# Patient Record
Sex: Female | Born: 1955 | Race: White | Hispanic: No | Marital: Married | Smoking: Never smoker
Health system: Western US, Academic
[De-identification: ages and names within clinical notes are randomized; demographics above are authoritative.]

---

## 2020-06-27 IMAGING — MR RM ANGIO CEREBRAL ARTERIAL
10 of 16 series · 16 of 48 positions shown · non-contrast
Comparison: none

[Series 101: survey · axial · 5.0mm · 0.98mm/px · 1 of 27 slices shown]
[im 1/27]
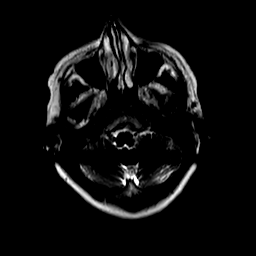

[Series 402: FLAIR · coronal · 4.0mm · 0.52mm/px · 1 of 30 slices shown (1 of 2)]
[im 1/30]
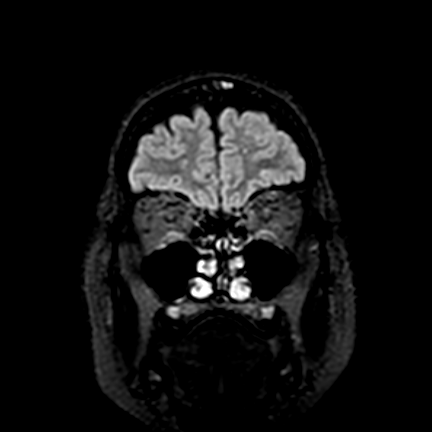

[Series 403: FLAIR · axial · 5.0mm · 0.58mm/px · 1 of 24 slices shown (2 of 2)]
[im 1/24]
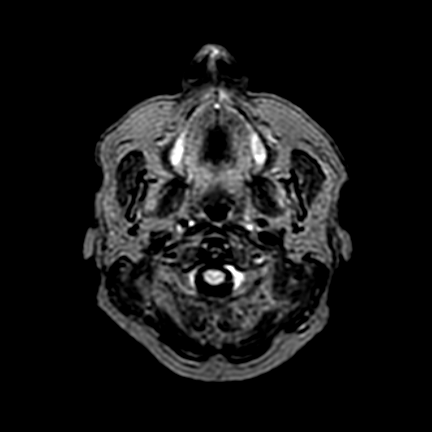

[Series 501: axi difusao · axial · 5.0mm · 0.78mm/px · z∈[-37,+106]mm · 2 of 50 slices shown]
[im 1/50]
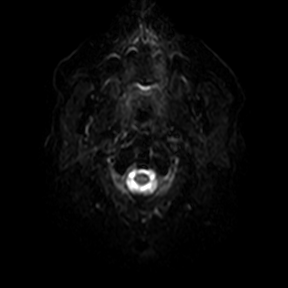
[im 50/50]
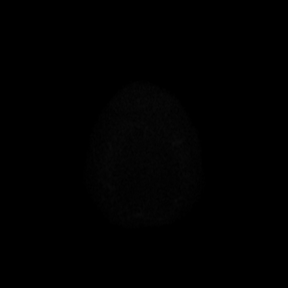

[Series 502: reg - reg · axial · 5.0mm · 0.78mm/px · z∈[-37,+106]mm · 2 of 50 slices shown]
[im 1/50]
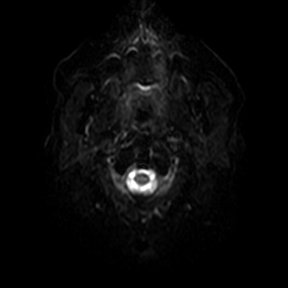
[im 50/50]
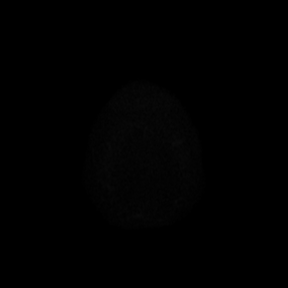

[Series 503: sb0 · axial · 5.0mm · 0.78mm/px · 1 of 25 slices shown]
[im 1/25]
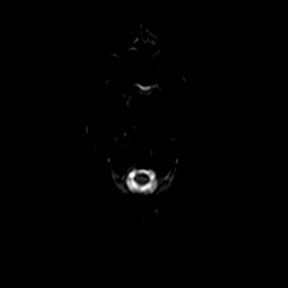

[Series 504: (id) · axial · 5.0mm · 0.78mm/px · 1 of 25 slices shown]
[im 1/25]
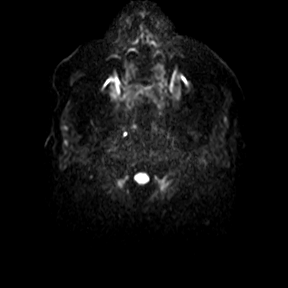

[Series 505: dadc · axial · 5.0mm · 0.78mm/px · 1 of 25 slices shown]
[im 1/25]
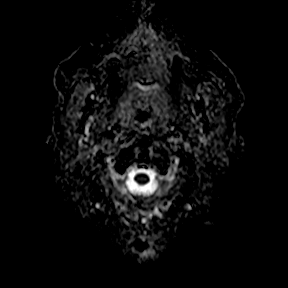

[Series 601: SWI · axial · 2.5mm · 0.43mm/px · z∈[-39,+108]mm · 5 of 120 slices shown (1 of 2)]
[im 1/120]
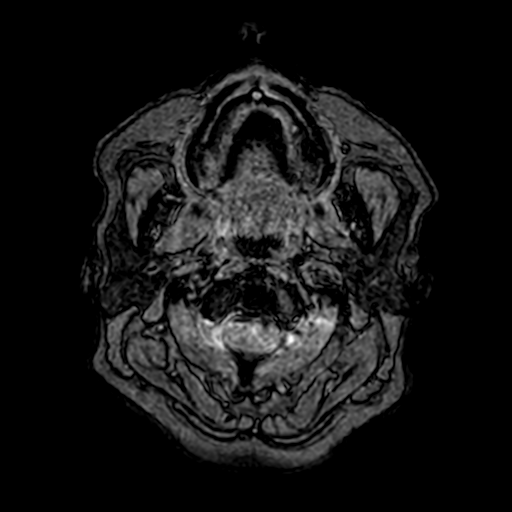
[im 30/120]
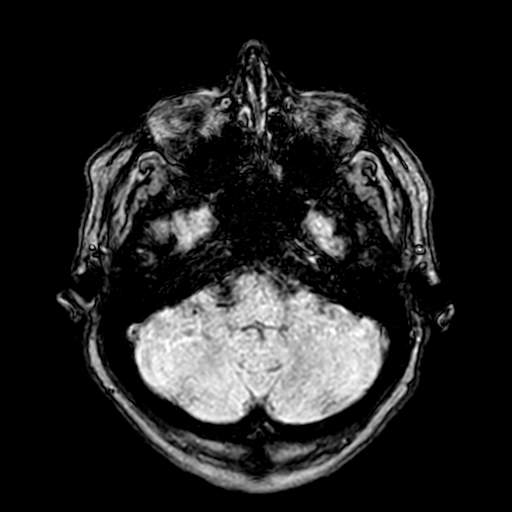
[im 60/120]
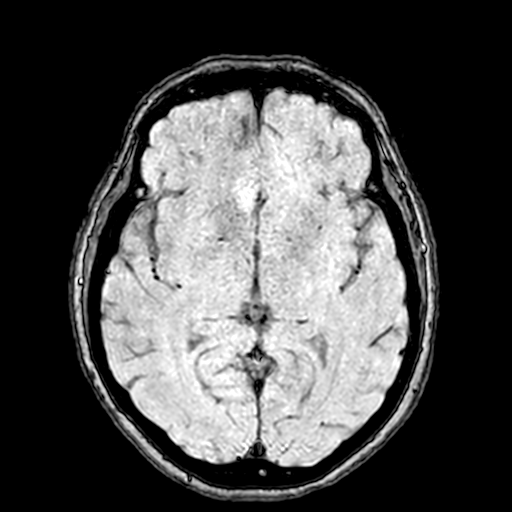
[im 90/120]
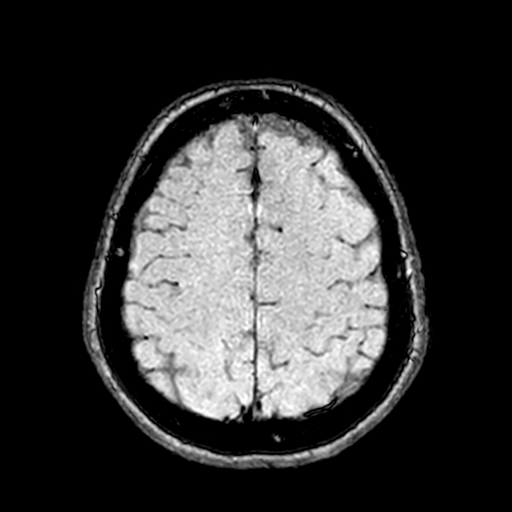
[im 120/120]
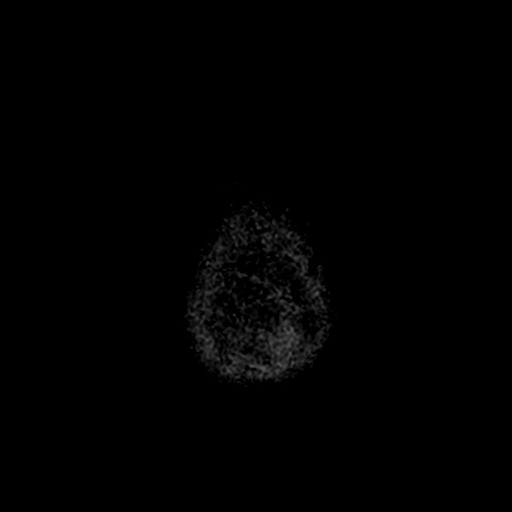

[Series 602: SWI · axial · 5.0mm · 0.43mm/px · 1 of 23 slices shown (2 of 2)]
[im 1/23]
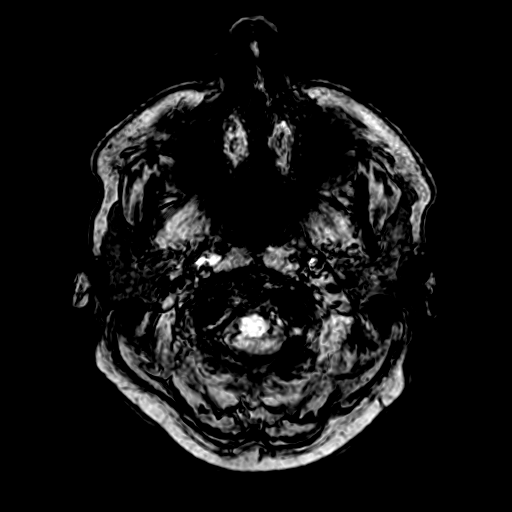

[16 of 48 positions shown; findings below may reference images not displayed]

[facility]
METODOLOGIA:
As imagens de angiorressonância arterial intracraniana foram obtidas com a técnica NE-SOR.
ANÁLISE:
Segmentos visibilizados das artérias carótidas internas não apresentam alterações significativas.
As artérias cerebrais anteriores, médias e posteriores apresentam calibre e sinal de fluxo normais.
ANGIORRESSONÂNCIA ARTERIAL INTRACRANIANA
Não há evidência de oclusões ou estenoses significativas nos segmentos intradurais das artérias vertebrais e 
artéria basilar.
Não há evidencia de malformações arteriovenosas ou aneurismas nos grandes ramos arteriais dos sistemas 
vértebro-basilar  e  carotídeo  intracranianos,  salientando-se  a  limitação  do  método  para  a  detecção  de 
pequenos aneurismas, sendo a angiografia digital com a técnica de subtração é o método "padrão ouro" para 
esta finalidade.
IMPRESSÃO:
Estudo por angiorressonância arterial intracraniana dentro dos padrões da normalidade.

## 2020-06-27 IMAGING — MR RM BRACO DIR
8 of 10 series · 27 of 40 positions shown · non-contrast
Comparison: none

[Series 101: survey · axial · 10.0mm · 0.72mm/px · 1 of 15 slices shown]
[im 1/15]
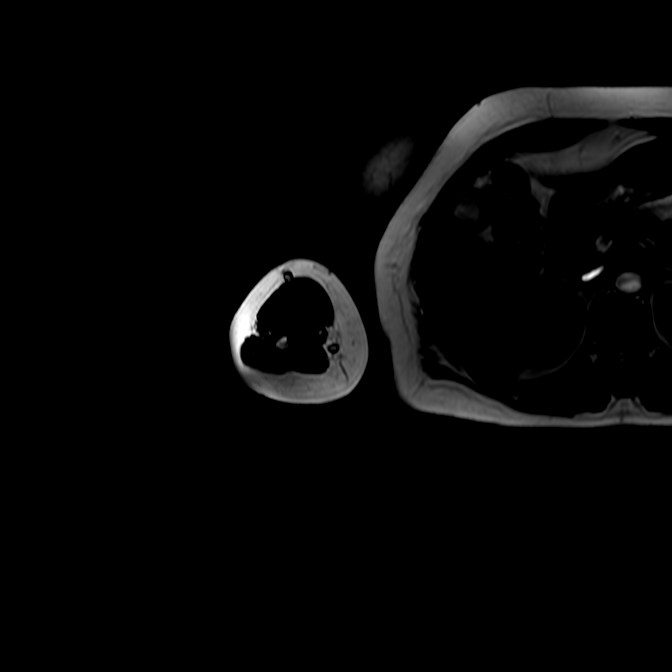

[Series 201: T1 · coronal · 3.5mm · 0.65mm/px · 4 of 48 slices shown (1 of 2)]
[im 1/48]
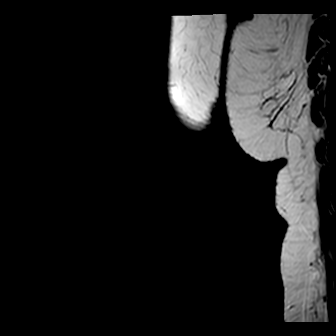
[im 16/48]
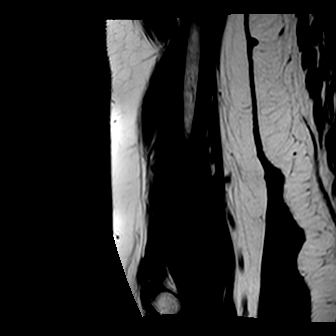
[im 32/48]
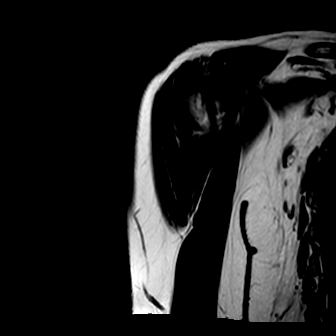
[im 48/48]
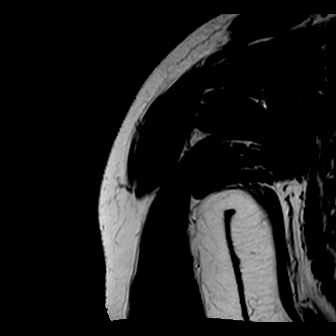

[Series 202: T1 · coronal · 3.5mm · 0.65mm/px · 3 of 24 slices shown (2 of 2)]
[im 1/24]
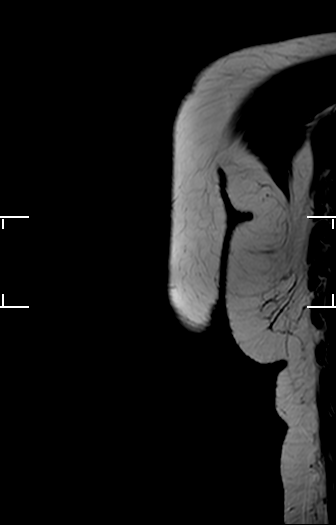
[im 12/24]
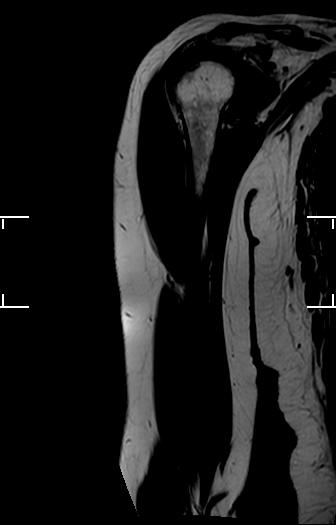
[im 24/24]
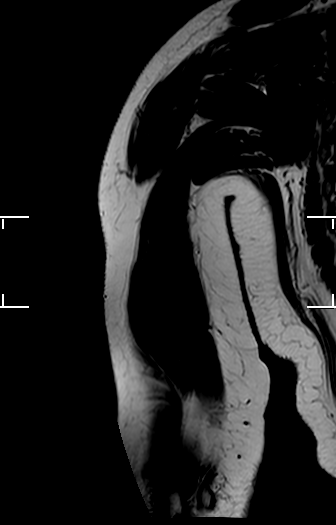

[Series 301: STIR · sagittal · 3.5mm · 0.56mm/px · 5 of 48 slices shown (1 of 5)]
[im 1/48]
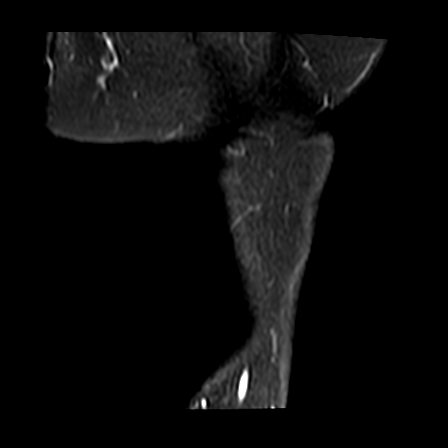
[im 12/48]
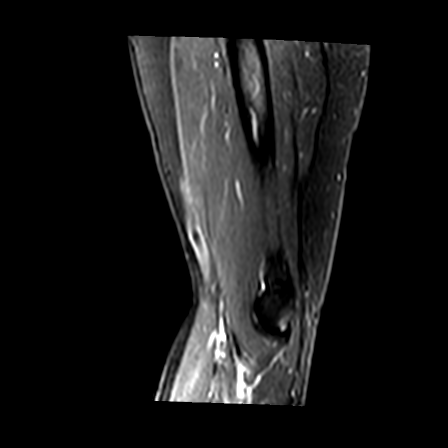
[im 24/48]
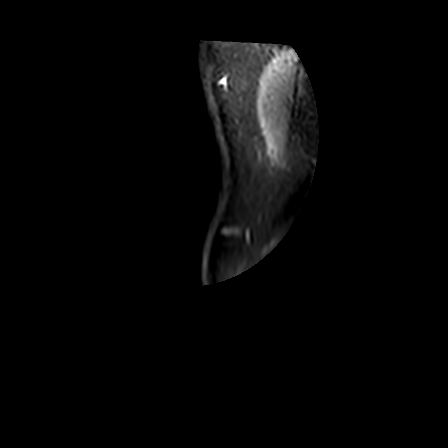
[im 36/48]
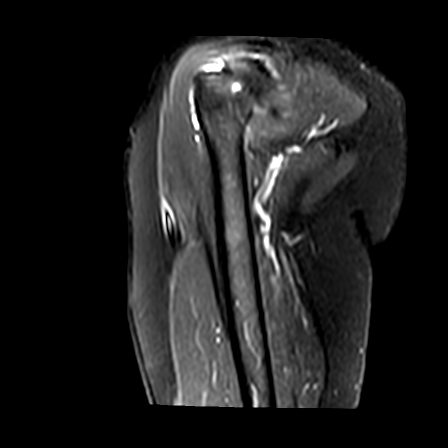
[im 48/48]
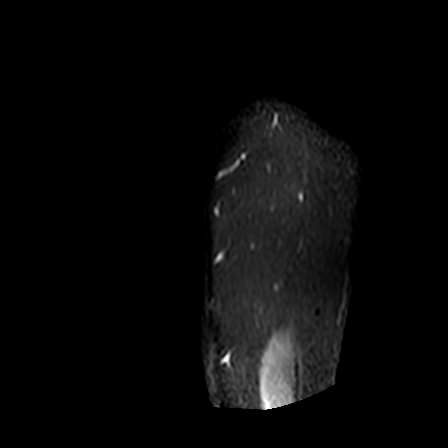

[Series 302: STIR · sagittal · 3.5mm · 0.56mm/px · 3 of 24 slices shown (2 of 5)]
[im 1/24]
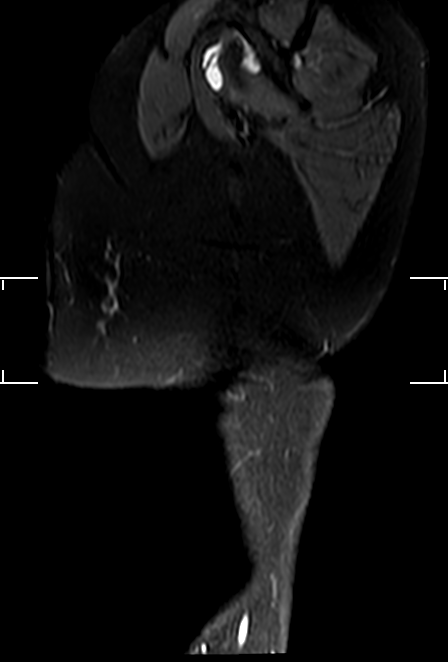
[im 12/24]
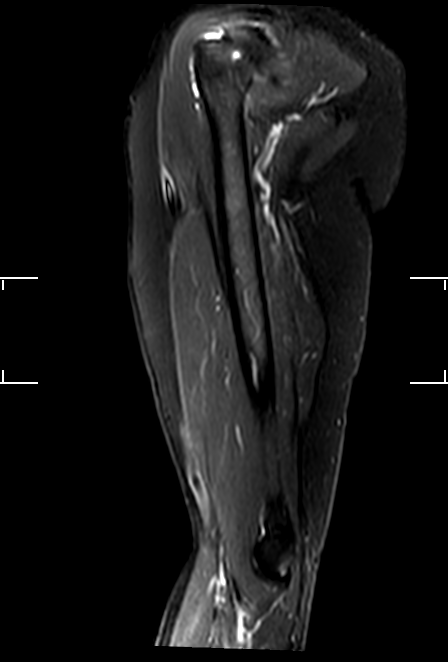
[im 24/24]
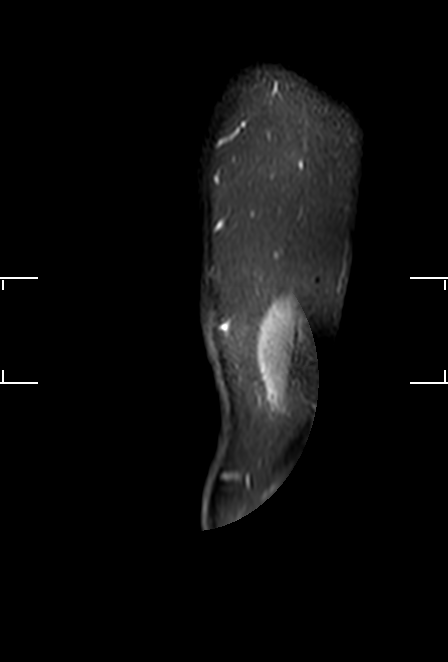

[Series 401: STIR · coronal · 3.5mm · 0.55mm/px · 6 of 49 slices shown (3 of 5)]
[im 1/49]
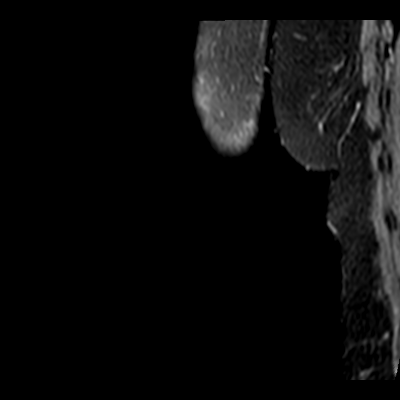
[im 10/49]
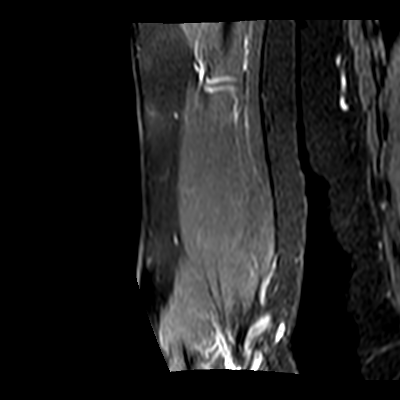
[im 20/49]
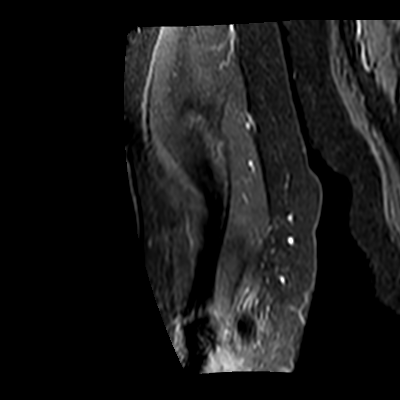
[im 29/49]
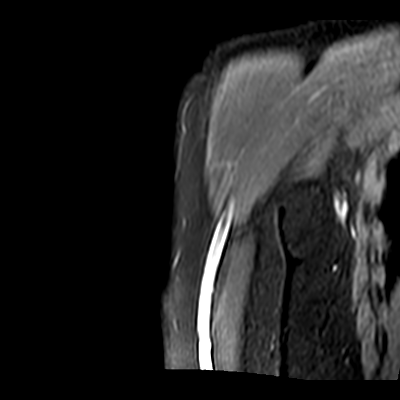
[im 39/49]
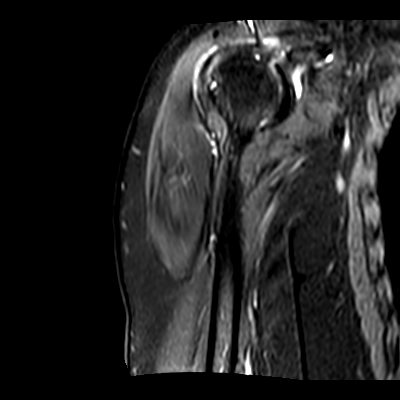
[im 49/49]
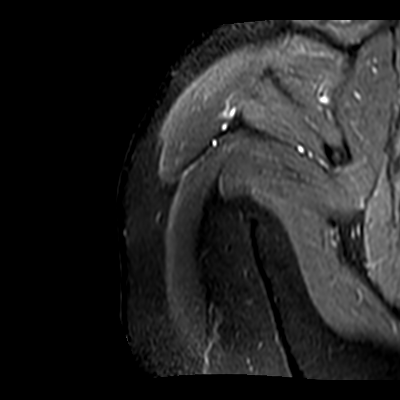

[Series 402: STIR · 3 of 25 slices shown (4 of 5)]
[im 1/25]
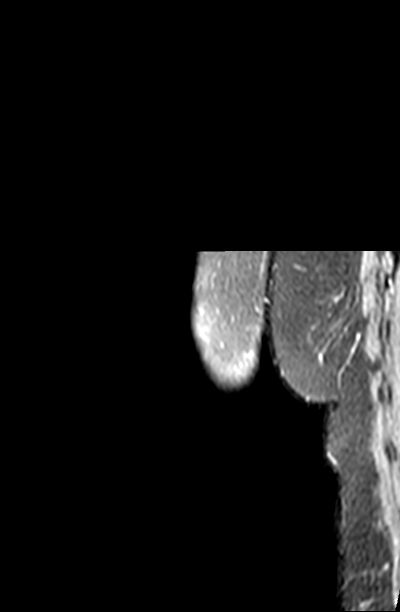
[im 13/25]
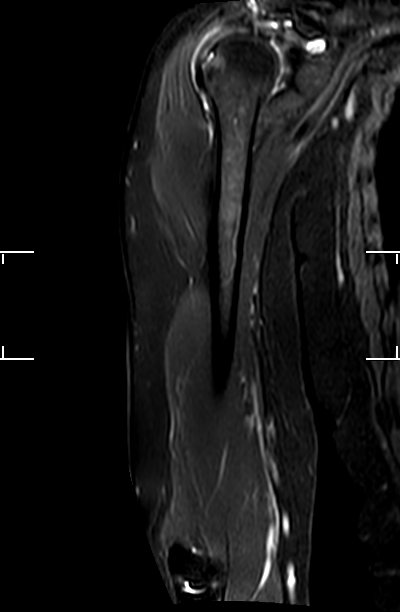
[im 25/25]
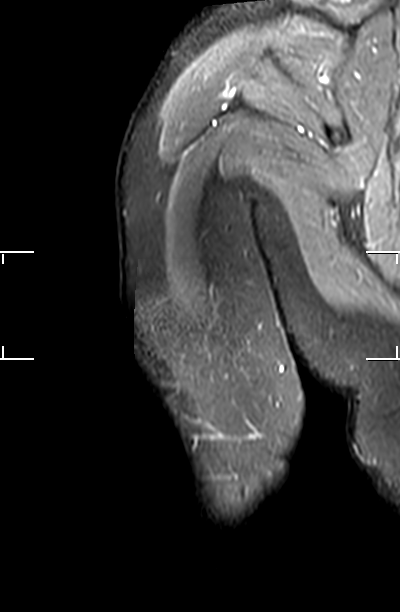

[Series 501: STIR · axial · 6.0mm · 0.52mm/px · z∈[-183,-126]mm · 2 of 40 slices shown (5 of 5)]
[im 1/40]
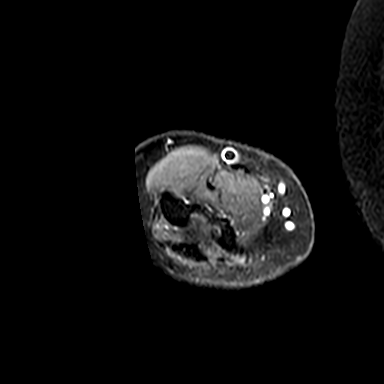
[im 10/40]
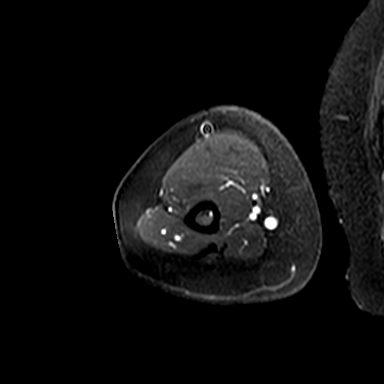

[27 of 40 positions shown; findings below may reference images not displayed]

[facility]
METODOLOGIA: 
Exame  realizado  com  sequências  ponderadas  em  T1  e  T2,  em  aquisições  multiplanares,sem   a 
administração do meio de contraste paramagnético por via endovenosa.
ANÁLISE: 
Discreto derrame articular glenoumeral,
RESSONÂNCIA MAGNÉTICA DO BRAÇO DIREITO
Distensão difusa da bolsa subacromial subdeltoideana,
Derrame articular discreto no cotovelo.
Alterações artrosicas na articulação acrômio clavicular..
Não há sinais de fraturas.
Planos musculares anatômicos.
Feixes vasculares preservados.
Ausência de formações expansivas.
IMPRESSÃO: 
Derrame articular no ombro e no cotovelo.
Bursite subacromial-subdeltóideana

Artrose acromioclavicular.

## 2020-09-02 IMAGING — MR RM OMBRO DIREITO
4 of 8 series · 16 of 40 positions shown · non-contrast
Comparison: none

[Series 301: raxi dp spair · axial · 3.0mm · 0.31mm/px · z∈[-21,+74]mm · 3 of 30 slices shown]
[im 1/30]
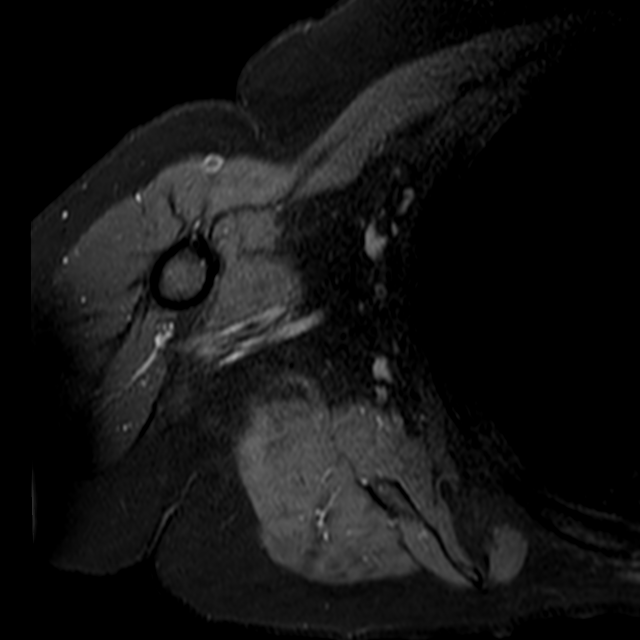
[im 15/30]
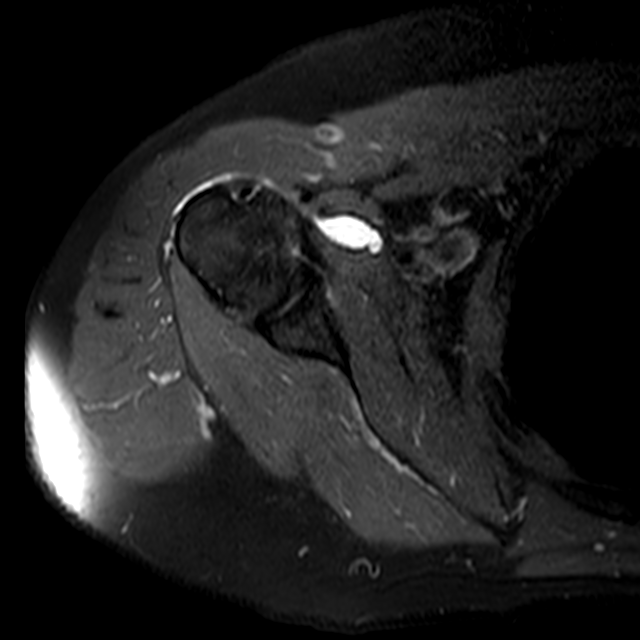
[im 30/30]
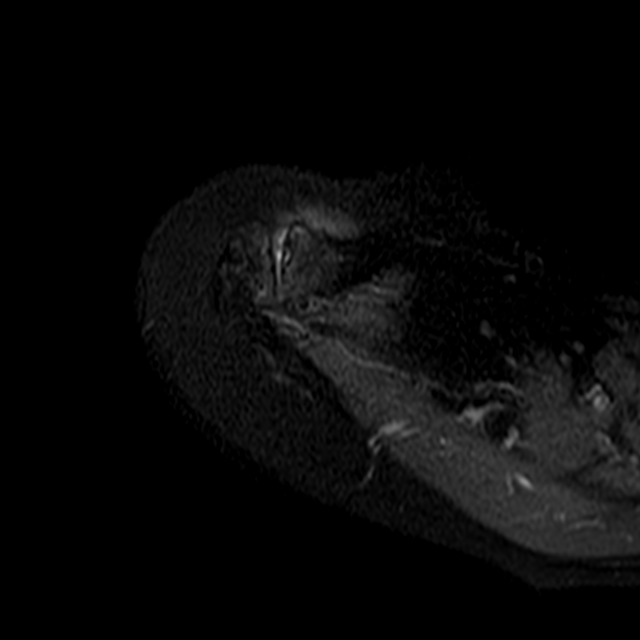

[Series 302: eraxi dp spair · axial · 3.0mm · 0.31mm/px · z∈[-21,+74]mm · 3 of 30 slices shown]
[im 1/30]
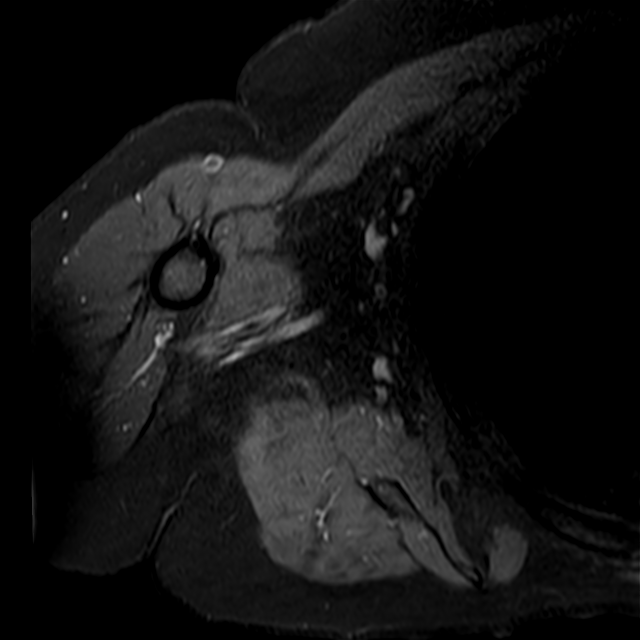
[im 15/30]
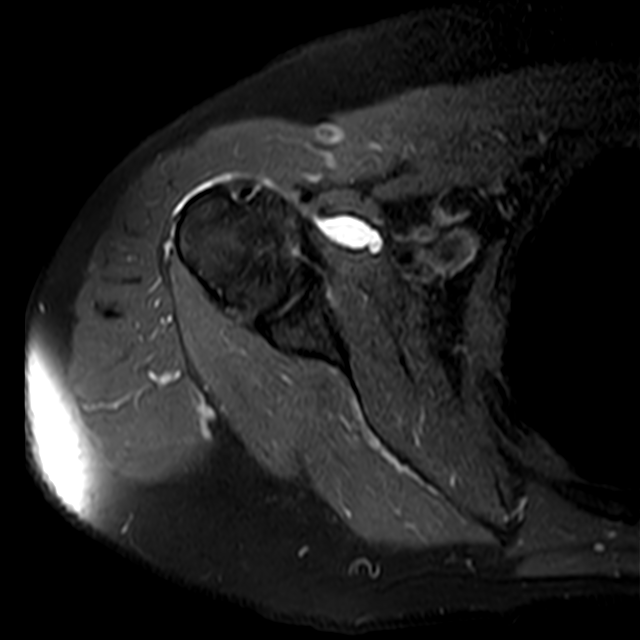
[im 30/30]
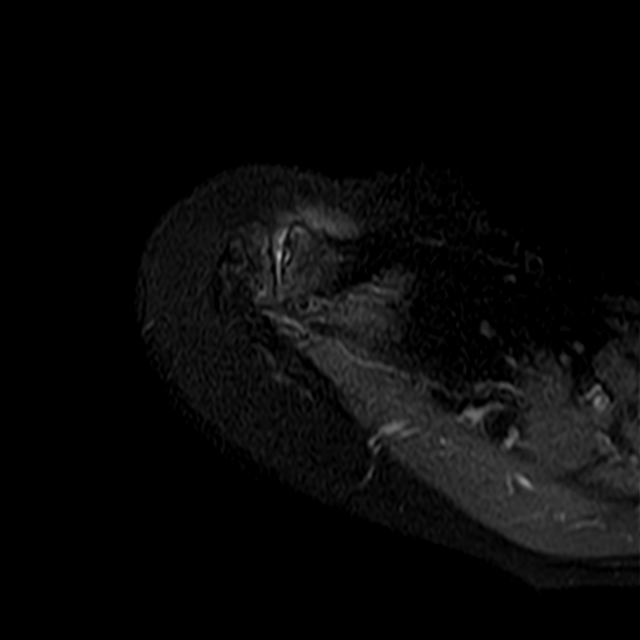

[Series 501: T1 · coronal · 3.5mm · 0.36mm/px · 5 of 25 slices shown (1 of 2)]
[im 1/25]
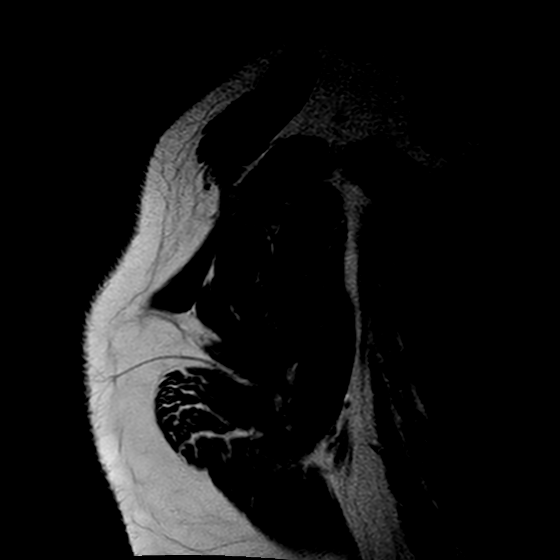
[im 7/25]
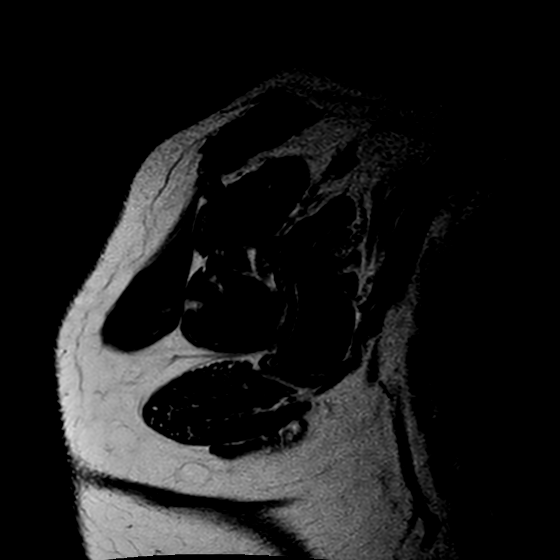
[im 13/25]
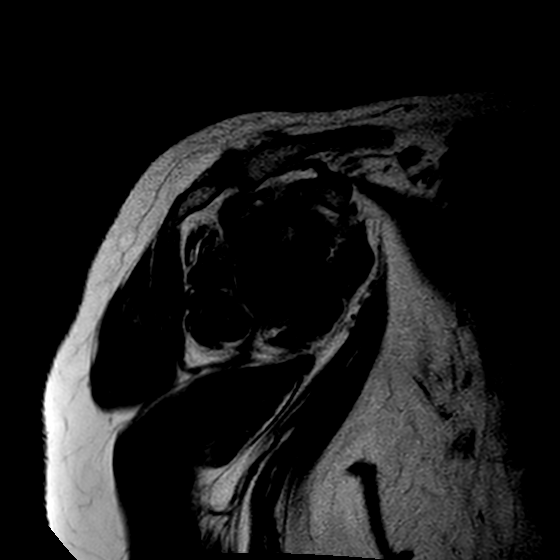
[im 19/25]
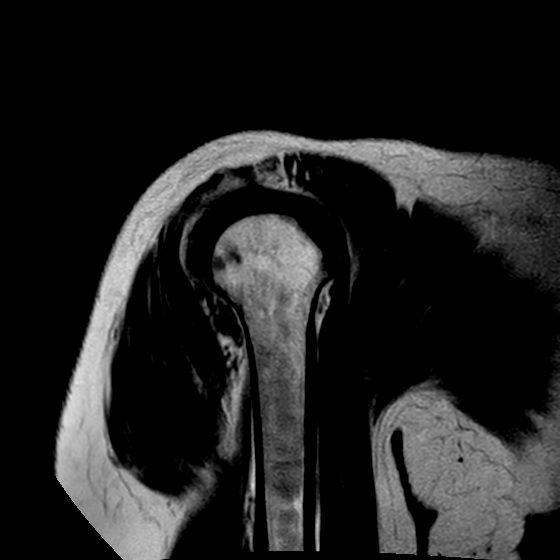
[im 25/25]
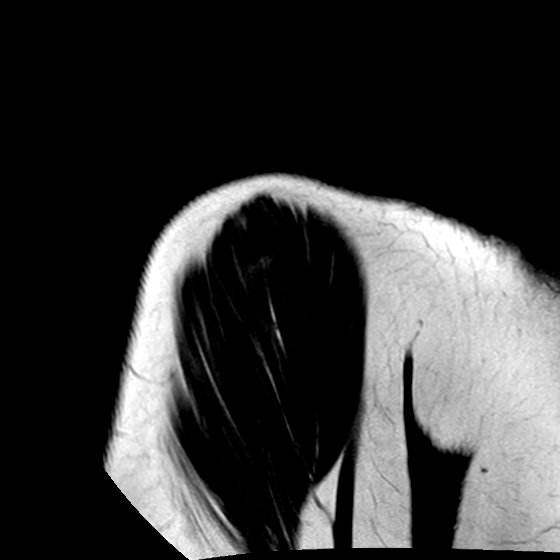

[Series 601: T1 · sagittal · 3.0mm · 0.39mm/px · 5 of 25 slices shown (2 of 2)]
[im 1/25]
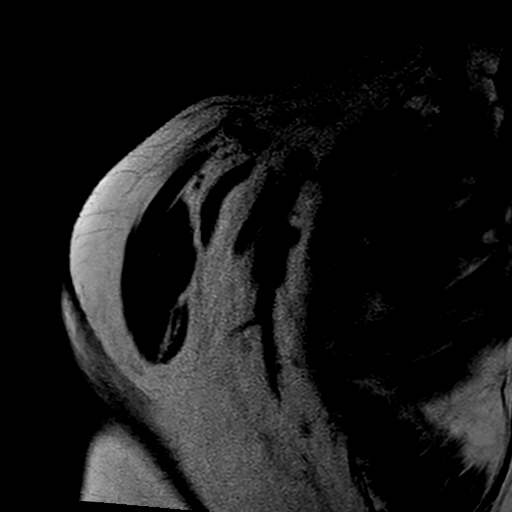
[im 7/25]
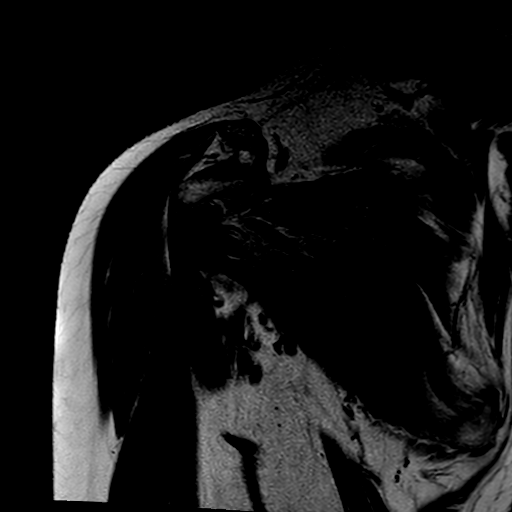
[im 13/25]
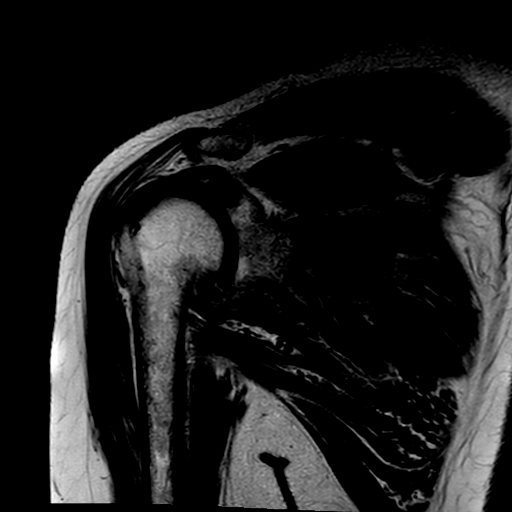
[im 19/25]
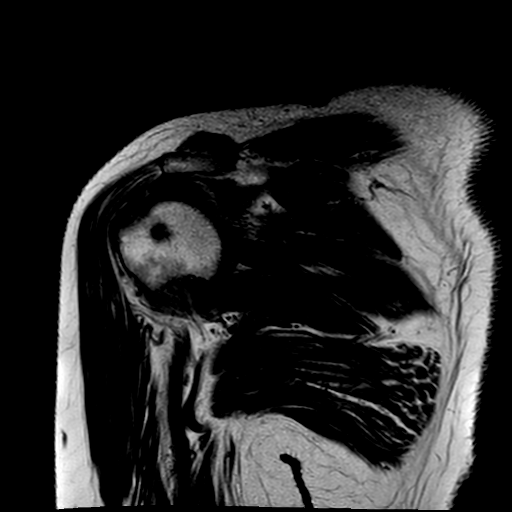
[im 25/25]
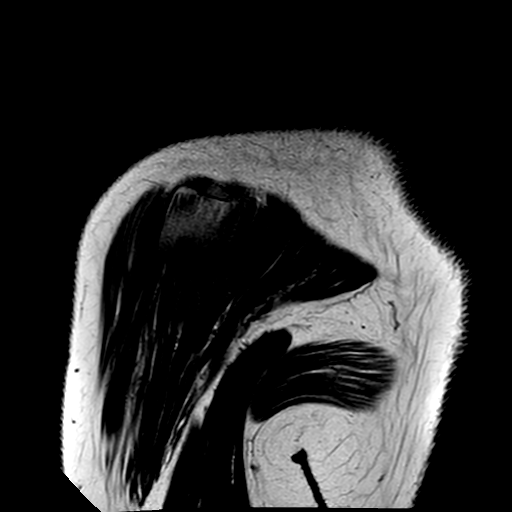

[16 of 40 positions shown; findings below may reference images not displayed]

RESSONÂNCIA MAGNÉTICA DO OMBRO DIREITO
TÉCNICA: Exame realizado em equipamento de alto campo, sendo obtidas imagens multiplanares ponderadas
em T1, T2 e STIR.
RELATÓRIO: 
Acrômio de morfologia curva, sem inclinação anômala.
Articulação acromioclavicular apresentando alterações degenerativas.
Demais estruturas ósseas e restante das superfícies condrais preservadas.
Lábio da glenoide sem sinais de roturas.
Tendinopatia do supraespinal, com rotura transfixante insercional nas fibras dos terços
anterior e médio, medindo cerca de 1,2 cm de extensão, com retração proximal estimada em
cerca de 1,4 cm.
Moderada tendinopatia do infraespinal e do subescapular, sem roturas.
Tendão da cabeça longa do músculo bíceps braquial de espessura, sinal e trajeto no sulco intertubercular
normais.
Pequeno derrame articular, comunicando-se com a bursa subacromial subdeltoidea.
Estruturas musculares preservadas.
Planos adiposos e neurovasculares íntegros.

## 2022-11-11 ENCOUNTER — Other Ambulatory Visit: Admit: 2022-11-11 | Discharge: 2022-11-11 | Disposition: A | Discharge status: Home Health | Payer: Medicare Other

## 2022-11-11 ENCOUNTER — Ambulatory Visit: Payer: Medicare Other | Attending: Adolescent Medicine | Admitting: Adolescent Medicine

## 2022-11-11 VITALS — BP 158/96 | HR 81 | Wt 174.2 lb

## 2022-11-11 DIAGNOSIS — H04129 Dry eye syndrome of unspecified lacrimal gland: Secondary | ICD-10-CM | POA: Insufficient documentation

## 2022-11-11 DIAGNOSIS — M2550 Pain in unspecified joint: Secondary | ICD-10-CM

## 2022-11-11 DIAGNOSIS — J309 Allergic rhinitis, unspecified: Secondary | ICD-10-CM | POA: Insufficient documentation

## 2022-11-11 DIAGNOSIS — E039 Hypothyroidism, unspecified: Secondary | ICD-10-CM | POA: Insufficient documentation

## 2022-11-11 DIAGNOSIS — R682 Dry mouth, unspecified: Secondary | ICD-10-CM

## 2022-11-11 DIAGNOSIS — K219 Gastro-esophageal reflux disease without esophagitis: Secondary | ICD-10-CM

## 2022-11-11 DIAGNOSIS — E785 Hyperlipidemia, unspecified: Secondary | ICD-10-CM

## 2022-11-11 LAB — ANTI-THYROID PEROXIDASE AB, BLOOD: Anti TPO Abs: 23 IU/ML — ABNORMAL HIGH (ref <9)

## 2022-11-11 MED ORDER — ZOLPIDEM TARTRATE 10 MG OR TABS
10.00 mg | ORAL_TABLET | Freq: Every evening | ORAL | Status: AC | PRN
Start: 2022-10-18 — End: ?

## 2022-11-11 MED ORDER — OMEPRAZOLE 40 MG OR CPDR
40.00 mg | DELAYED_RELEASE_CAPSULE | Freq: Every day | ORAL | Status: AC
Start: 2022-08-30 — End: ?

## 2022-11-11 MED ORDER — SPIRONOLACTONE 25 MG OR TABS
25.00 mg | ORAL_TABLET | Freq: Every day | ORAL | Status: AC
Start: 2022-10-01 — End: ?

## 2022-11-11 MED ORDER — ALPRAZOLAM 1 MG OR TABS
1.00 mg | ORAL_TABLET | Freq: Two times a day (BID) | ORAL | Status: AC | PRN
Start: 2022-10-26 — End: ?

## 2022-11-11 MED ORDER — GABAPENTIN 300 MG OR CAPS
300.00 mg | ORAL_CAPSULE | Freq: Three times a day (TID) | ORAL | Status: AC
Start: 2022-10-26 — End: ?

## 2022-11-11 MED ORDER — LEVOTHYROXINE SODIUM 100 MCG OR TABS
100.00 ug | ORAL_TABLET | Freq: Every day | ORAL | Status: AC
Start: 2022-10-01 — End: ?

## 2022-11-11 MED ORDER — FENOFIBRATE 160 MG OR TABS
160.00 mg | ORAL_TABLET | Freq: Every day | ORAL | Status: AC
Start: 2022-10-26 — End: ?

## 2022-11-11 MED ORDER — CYCLOSPORINE 0.05 % OP EMUL
1.00 [drp] | Freq: Two times a day (BID) | OPHTHALMIC | Status: AC
Start: 2022-09-24 — End: ?

## 2022-11-11 MED ORDER — SUMATRIPTAN SUCCINATE 100 MG OR TABS
100.00 mg | ORAL_TABLET | Freq: Once | ORAL | Status: AC | PRN
Start: 2022-11-08 — End: ?

## 2022-11-11 MED ORDER — ROSUVASTATIN CALCIUM 40 MG OR TABS
40.00 mg | ORAL_TABLET | Freq: Every day | ORAL | Status: AC
Start: 2022-11-05 — End: ?

## 2022-11-11 MED ORDER — PHENDIMETRAZINE TARTRATE 35 MG OR TABS
1.00 | ORAL_TABLET | Freq: Three times a day (TID) | ORAL | Status: AC
Start: 2022-10-22 — End: ?

## 2022-11-11 MED ORDER — AUG BETAMETHASONE DIPROPIONATE 0.05 % EX OINT
1.00 | TOPICAL_OINTMENT | Freq: Every day | CUTANEOUS | Status: AC
Start: 2022-10-12 — End: ?

## 2022-11-11 NOTE — Progress Notes (Signed)
Date of Evaluation:  November 11, 2022    Referring Physician:  Self, Referred    Reason(s) for Referral:  Dry lips    History of Present Illness:  This is a 67 year old female with history dry chapped lips, dry eyes, and dry mouth since 2022.  She is as in good health without any history of infections or evidence of immunodeficiency. 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 She does not have HTN and is not on anti-hypertensive (contrary t the diagnosis written in notes from [facility]).  She has welling of left 1st MCP joint. She is also complaining of rhinittis.    Past Medical History;  Mixed hyperlipidemia  Migraine    Family History:  Daughter had UTI.  One sister with breast [place]. One sister had recurrent URI and underwent tonsillectomy.  Mother and grandmother with a history of breast [place].    Social History:      Review of Systems:  CNS:  No headache or dizziness.   CVS:  No palpitation or chest pain.  Respiratory:  No cough or shortness of breath.  GI:  Bowel movements are regular  GU:  No frequency or dysuria  Endocrine: normal  Musculoskeletal:  No myalgias or arthralgias.  Peripheral Nerve:  No numbness or tingling.  The remainder of review of system is normal.    Physical Exam:  HEENT:  Throat normal.  Both pupils equal and reacting to light.  Neck:  Supple.  No lymphadenopathy.  Lungs:  Clear.  Heart:  Within normal limits.  Abdomen:  No organomegaly or mass.  No tenderness.  Extremities: swelling of left MCP joint, only mildly tender.  Neurological:  Normal  Skin:  No skin rashes.    Diagnostic Data:  None    Assessment and Recommendations:   Patient with Sicca symptoms, post-partum hypothyroidism, and arthralgia is evaluated for immunodeficency, Sjogren syndromes.  Labs have been ordered. Labs will be reviewed via zoom appointment.

## 2022-11-12 LAB — ANA (ANTI-NUCLEAR AB), BLOOD: Antinuclear Antibody (ANA), HEp-2000, IgG: NEGATIVE

## 2022-11-12 LAB — LYMPHOCYTE SUBSET PANEL
CD19,%: 15 % (ref 6–29)
CD19,Tot: 312 /MCL (ref 104–795)
CD3+CD4+,%: 61 % (ref 24–64)
CD3+CD4+: 1269 /MCL (ref 477–1634)
CD3+CD8+,%: 14 % (ref 12–45)
CD3+CD8+: 291 /MCL (ref 168–1315)
CD3,%: 76 % (ref 58–89)
CD3,Tot: 1581 /MCL (ref 700–2377)
CD4:CD8: 4.36 RATIO (ref 0.8–5.0)
CD56,%: 8 % (ref 2–32)
CD56,Tot: 166 /MCL (ref 43–680)
Lymphocytes,%: 32 % (ref 14–44)
Tot WBC Count: 6500 /MCL (ref 4000–10500)
Total Lymphocytes: 2080 /MCL (ref 900–3300)

## 2022-11-12 LAB — IGG SUBCLASS PANEL, BLOOD
IgG 1: 408 mg/dL (ref 382–929)
IgG 2: 155 mg/dL — ABNORMAL LOW (ref 242–700)
IgG 3: 9 mg/dL — ABNORMAL LOW (ref 22–176)
IgG 4: 6 mg/dL (ref 5–125)
IgG Total: 665 MG/DL (ref 610–1616)

## 2022-11-12 LAB — IGA, BLOOD: Immunoglobulin A: 37 mg/dL — ABNORMAL LOW (ref 84–499)

## 2022-11-12 LAB — IGM, BLOOD: Immunoglobulin M: 43 mg/dL (ref 35–242)

## 2022-11-14 LAB — TETANUS ANTIBODY, IGG, BLOOD: Tetanus Ab IgG: 0.4 IU/mL

## 2022-11-16 LAB — SSA (RO)(ENA) ANTIBODY, IGG: SSA (RO)(ENA) Ab, IgG: 2 UNITS (ref 0–19)

## 2022-11-16 LAB — SSB, BLOOD: SSB (LA)(ENA) Ab, IgG: 2 UNITS (ref 0–19)

## 2022-11-17 LAB — STREPTOCOCCUS PNEUMONIAE ANTIBODY IGG 23 SEROTYPES
S pneumon Ab IgG - 10A: 0.23 ug/mL
S pneumon Ab IgG - 11A: 0.21 ug/mL
S pneumon Ab IgG - 12F: 0.07 ug/mL
S pneumon Ab IgG - 14*: 1.3 ug/mL
S pneumon Ab IgG - 15B: <0.1 ug/mL
S pneumon Ab IgG - 17F: 0.54 ug/mL
S pneumon Ab IgG - 18C*: 0.07 ug/mL
S pneumon Ab IgG - 19A: 0.79 ug/mL
S pneumon Ab IgG - 19F*: 0.36 ug/mL
S pneumon Ab IgG - 1: 0.54 ug/mL
S pneumon Ab IgG - 20: 1.77 ug/mL
S pneumon Ab IgG - 22F: 0.07 ug/mL
S pneumon Ab IgG - 23F*: 0.03 ug/mL
S pneumon Ab IgG - 2: <0.09 ug/mL
S pneumon Ab IgG - 33F: 0.78 ug/mL
S pneumon Ab IgG - 3: 0.19 ug/mL
S pneumon Ab IgG - 4*: 0.1 ug/mL
S pneumon Ab IgG - 5: 0.18 ug/mL
S pneumon Ab IgG - 6B*: 0.09 ug/mL
S pneumon Ab IgG - 7F: 0.4 ug/mL
S pneumon Ab IgG - 8: 0.13 ug/mL
S pneumon Ab IgG - 9N: 0.05 ug/mL
S pneumon Ab IgG - 9V*: 0.06 ug/mL

## 2022-11-22 LAB — CYCLIC CITRUL PEP AB, IGG: Anti-CCP Ab IgG: <5 CU (ref <20.0)

## 2022-11-24 LAB — MISCELLANEOUS TEST (CHEMISTRY)

## 2023-01-13 ENCOUNTER — Ambulatory Visit: Payer: Medicare Other | Attending: Adolescent Medicine | Admitting: Adolescent Medicine

## 2023-01-13 VITALS — BP 147/83 | HR 76 | Ht 64.0 in | Wt 173.6 lb

## 2023-01-13 DIAGNOSIS — Z6829 Body mass index (BMI) 29.0-29.9, adult: Secondary | ICD-10-CM | POA: Insufficient documentation

## 2023-01-13 DIAGNOSIS — D804 Selective deficiency of immunoglobulin M [IgM]: Secondary | ICD-10-CM | POA: Insufficient documentation

## 2023-01-13 DIAGNOSIS — R682 Dry mouth, unspecified: Secondary | ICD-10-CM | POA: Insufficient documentation

## 2023-01-13 DIAGNOSIS — H04123 Dry eye syndrome of bilateral lacrimal glands: Secondary | ICD-10-CM | POA: Insufficient documentation

## 2023-01-13 DIAGNOSIS — D803 Selective deficiency of immunoglobulin G [IgG] subclasses: Secondary | ICD-10-CM | POA: Insufficient documentation

## 2023-01-13 DIAGNOSIS — D802 Selective deficiency of immunoglobulin A [IgA]: Secondary | ICD-10-CM | POA: Insufficient documentation

## 2023-01-13 DIAGNOSIS — Z23 Encounter for immunization: Secondary | ICD-10-CM | POA: Insufficient documentation

## 2023-01-13 DIAGNOSIS — E063 Autoimmune thyroiditis: Secondary | ICD-10-CM | POA: Insufficient documentation

## 2023-01-13 MED ORDER — PNEUMOCOCCAL VAC POLYVALENT 25 MCG/0.5ML IJ INJ (CUSTOM)
0.5000 mL | INJECTION | Freq: Once | INTRAMUSCULAR | Status: AC
Start: 2023-01-13 — End: 2023-01-13
  Administered 2023-01-13: 0.5 mL via INTRAMUSCULAR

## 2023-01-13 NOTE — Progress Notes (Signed)
Date of Evaluation:  January 13, 2023    Referring Physician:  Blain Pais    History of Present Illness:  This is a 67 year old female with Patient with Sicca symptoms, post-partum hypothyroidism, and arthralgia was evaluated for immunodeficency, Sjogren syndromes. SSA, SSB, ANA, anti-CCP antibodies  and Immunocap fro allergy are negative. Anti-TPO antibodies are positive suggestive of Hashimoto's thyroiditis. IgG 2, and 3 are low, IgA is low., IgM is normal. Specific antibodies against most serotypes are unprotective.    Review of Systems:  CNS:  No headache or dizziness.   CVS:  No palpitation or chest pain.  Respiratory:  No cough or shortness of breath.  GI:  Bowel movements are regular  GU:  No frequency or dysuria  Endocrine: normal  Musculoskeletal:  No myalgias or arthralgias.  Peripheral Nerve:  No numbness or tingling.  The remainder of review of system is normal.      Physical Exam:  HEENT:  Throat normal.  Both pupils equal and reacting to light.  Neck:  Supple.  No lymphadenopathy.  Lungs:  Clear.  Heart:  Within normal limits.  Abdomen:  No organomegaly or mass.  No tenderness.  Extremities:  Normal.  Neurological:  Normal  Skin:  No skin rashes.    Assessment and Recommendations:   Patient with sicca symptoms with SSA and SSBA antibodies negative (?seronegative SS), Hashimoto's thyroiditis, IgG subclass deficiency (IgG2 and IgG3), and low IgA.  Pneumovax-23 is given to test specific antibody response.  Repeat blood test after 4 weeks. We will then discuss plan of treatment at next visit. Ispent  30 min reviewing labs and their interpresetation, and plan to future treatment.

## 2023-03-14 ENCOUNTER — Other Ambulatory Visit
Admission: RE | Admit: 2023-03-14 | Discharge: 2023-03-14 | Disposition: A | Discharge status: Home / Self Care | Payer: Medicare Other | Attending: Adolescent Medicine | Admitting: Adolescent Medicine

## 2023-03-14 DIAGNOSIS — D802 Selective deficiency of immunoglobulin A [IgA]: Secondary | ICD-10-CM | POA: Insufficient documentation

## 2023-03-14 DIAGNOSIS — D803 Selective deficiency of immunoglobulin G [IgG] subclasses: Secondary | ICD-10-CM | POA: Insufficient documentation

## 2023-03-14 DIAGNOSIS — D804 Selective deficiency of immunoglobulin M [IgM]: Secondary | ICD-10-CM | POA: Insufficient documentation

## 2023-03-17 LAB — STREPTOCOCCUS PNEUMONIAE ANTIBODY IGG 23 SEROTYPES
S pneumon Ab IgG - 10A: 2.84 ug/mL
S pneumon Ab IgG - 11A: 2.69 ug/mL
S pneumon Ab IgG - 12F: 0.17 ug/mL
S pneumon Ab IgG - 14*: 3.01 ug/mL
S pneumon Ab IgG - 15B: 0.41 ug/mL
S pneumon Ab IgG - 17F: 5.29 ug/mL
S pneumon Ab IgG - 18C*: 2.15 ug/mL
S pneumon Ab IgG - 19A: 13.61 ug/mL
S pneumon Ab IgG - 19F*: 1.69 ug/mL
S pneumon Ab IgG - 1: 9.52 ug/mL
S pneumon Ab IgG - 20: 13.6 ug/mL
S pneumon Ab IgG - 22F: 0.55 ug/mL
S pneumon Ab IgG - 23F*: 0.15 ug/mL
S pneumon Ab IgG - 2: 1.26 ug/mL
S pneumon Ab IgG - 33F: >13.39 ug/mL
S pneumon Ab IgG - 3: 0.73 ug/mL
S pneumon Ab IgG - 4*: 1.54 ug/mL
S pneumon Ab IgG - 5: 1.81 ug/mL
S pneumon Ab IgG - 6B*: 0.34 ug/mL
S pneumon Ab IgG - 7F: 5.98 ug/mL
S pneumon Ab IgG - 8: 7.19 ug/mL
S pneumon Ab IgG - 9N: 1.93 ug/mL
S pneumon Ab IgG - 9V*: 1.92 ug/mL

## 2023-03-31 ENCOUNTER — Encounter: Payer: Self-pay | Admitting: Adolescent Medicine

## 2023-03-31 ENCOUNTER — Ambulatory Visit: Payer: Medicare Other | Attending: Adolescent Medicine | Admitting: Adolescent Medicine

## 2023-03-31 VITALS — BP 139/81 | HR 78 | Wt 174.9 lb

## 2023-03-31 DIAGNOSIS — D803 Selective deficiency of immunoglobulin G [IgG] subclasses: Secondary | ICD-10-CM | POA: Insufficient documentation

## 2023-03-31 DIAGNOSIS — Z6830 Body mass index (BMI) 30.0-30.9, adult: Secondary | ICD-10-CM | POA: Insufficient documentation

## 2023-03-31 DIAGNOSIS — D802 Selective deficiency of immunoglobulin A [IgA]: Secondary | ICD-10-CM | POA: Insufficient documentation

## 2023-03-31 DIAGNOSIS — M2550 Pain in unspecified joint: Secondary | ICD-10-CM | POA: Insufficient documentation

## 2023-03-31 DIAGNOSIS — R682 Dry mouth, unspecified: Secondary | ICD-10-CM | POA: Insufficient documentation

## 2023-03-31 DIAGNOSIS — H04123 Dry eye syndrome of bilateral lacrimal glands: Secondary | ICD-10-CM | POA: Insufficient documentation

## 2023-03-31 DIAGNOSIS — D804 Selective deficiency of immunoglobulin M [IgM]: Secondary | ICD-10-CM | POA: Insufficient documentation

## 2023-03-31 DIAGNOSIS — E063 Autoimmune thyroiditis: Secondary | ICD-10-CM | POA: Insufficient documentation

## 2023-03-31 MED ORDER — ESTRADIOL 0.1 MG/GM VA CREA: 1.0000 g | TOPICAL_CREAM | Freq: Every day | VAGINAL | Status: AC

## 2023-03-31 NOTE — Progress Notes (Signed)
Date of Evaluation:  March 31, 2023    Referring Physician:  Blain Pais    History of Present Illness:  Patient with sicca symptoms with SSA and SSBA antibodies negative (?seronegative SS), Hashimoto's thyroiditis with hypothyroidism, IgG subclass deficiency (IgG2 and IgG3), and low IgA. Pneumovax-23 is given to test specific antibody response. 7/23 are unprotective. Autoantibody screening is negative (except anti-TPO) . She is complaining of thick skin on hand and arthralgia.    Review of Systems:  CNS:  No headache or dizziness.   CVS:  No palpitation or chest pain.  Respiratory:  No cough or shortness of breath.  GI:  Bowel movements are regular  GU:  No frequency or dysuria  Endocrine: normal  Musculoskeletal:  No myalgias but arthralgias in hand.  Peripheral Nerve:  No numbness or tingling.  The remainder of review of system is normal.      Physical Exam:  HEENT:  Throat normal.  Both pupils equal and reacting to light.  Neck:  Supple.  No lymphadenopathy.  Lungs:  Clear.  Heart:  Within normal limits.  Abdomen:  No organomegaly or mass.  No tenderness.  Extremities:  Normal.  Neurological:  Normal  Skin:  No skin rashes, but thicknes of skin    Assessment and Recommendations:   Patient with sicca symptoms with SSA and SSBA antibodies negative (?seronegative SS), arthralgia, Hashimoto's thyroiditis, IgG subclass deficiency (IgG2 and IgG3), and low IgA with borderline response to . Pneumovax-23. Her sister has RA.  She is referred to Dr. Sherlie Ban. We plan to repeat pneumococaal antibody titers after 6 months for immunological memory.

## 2023-03-31 NOTE — Addendum Note (Signed)
Addended by: Limmie Patricia on: 03/31/2023 09:13 AM     Modules accepted: Orders

## 2023-09-06 ENCOUNTER — Other Ambulatory Visit
Admission: RE | Admit: 2023-09-06 | Discharge: 2023-09-06 | Disposition: A | Discharge status: Home Health | Attending: Anatomic Pathology & Clinical Pathology | Admitting: Anatomic Pathology & Clinical Pathology

## 2023-09-06 DIAGNOSIS — D803 Selective deficiency of immunoglobulin G [IgG] subclasses: Secondary | ICD-10-CM | POA: Insufficient documentation

## 2023-09-06 DIAGNOSIS — D804 Selective deficiency of immunoglobulin M [IgM]: Secondary | ICD-10-CM | POA: Insufficient documentation

## 2023-09-06 DIAGNOSIS — D802 Selective deficiency of immunoglobulin A [IgA]: Secondary | ICD-10-CM | POA: Insufficient documentation

## 2023-09-07 LAB — IGG SUBCLASS PANEL, BLOOD - ~~LOC~~
Immunoglobulin G Subclass 1: 442 mg/dL (ref 382–929)
Immunoglobulin G Subclass 2: 177 mg/dL — ABNORMAL LOW (ref 242–700)
Immunoglobulin G Subclass 3: 9 mg/dL — ABNORMAL LOW (ref 22–176)
Immunoglobulin G Subclass 4: 7 mg/dL (ref 5–125)
Immunoglobulin G: 700 mg/dL (ref 610–1616)

## 2023-09-07 LAB — IGM, BLOOD - ~~LOC~~: Immunoglobulin M: 53 mg/dL (ref 35–242)

## 2023-09-07 LAB — IGA, BLOOD - ~~LOC~~: Immunoglobulin A: 50 mg/dL — ABNORMAL LOW (ref 84–499)

## 2023-09-08 LAB — STREPTOCOCCUS PNEUMONIAE AB, IGG (23 SEROTYPES) - ~~LOC~~
Pn Serotype 1 IgG (P13,P20,PNX,V15): 7.02 ug/mL
Pn Serotype 10A IgG (P20,PNX): 2.15 ug/mL
Pn Serotype 11A IgG (P20,PNX): 1.52 ug/mL
Pn Serotype 12F IgG (P20,PNX): 0.15 ug/mL
Pn Serotype 14 IgG (P20,PNX): 3.62 ug/mL
Pn Serotype 15B IgG (P20,PNX): 0.13 ug/mL
Pn Serotype 17F IgG (PNX): 3.78 ug/mL
Pn Serotype 18C IgG (P7,P13,P20,PNX,V15): 1.92 ug/mL
Pn Serotype 19A IgG (P13,P20,PNX,V15): 5.89 ug/mL
Pn Serotype 19F IgG (P7,P13,P20,PNX,V15): 1.61 ug/mL
Pn Serotype 2 IgG (PNX): 1.17 ug/mL
Pn Serotype 20 IgG (PNX): 9.59 ug/mL
Pn Serotype 22F IgG (P20,PNX,V15): 0.32 ug/mL
Pn Serotype 23F IgG (P7,P13,P20,PNX,V15): 0.1 ug/mL
Pn Serotype 3 IgG (P13,P20,PNX,V15): 0.51 ug/mL
Pn Serotype 33F IgG (P20,PNX,V15): 8.62 ug/mL
Pn Serotype 4 IgG (P7,P13,P20,PNX,V15): 1.46 ug/mL
Pn Serotype 5 IgG (P13,P20,PNX,V15): 1.23 ug/mL
Pn Serotype 6B IgG (P7,P13,P20,PNX,V15): 0.17 ug/mL
Pn Serotype 7F IgG (P13,P20,PNX,V15): 4.44 ug/mL
Pn Serotype 8 IgG (P20, PNX): 7.41 ug/mL
Pn Serotype 9N IgG (PNX): 1.23 ug/mL
Pn Serotype 9V IgG (P7,P13,P20,PNX,V15): 2 ug/mL

## 2023-09-22 ENCOUNTER — Encounter: Payer: Self-pay | Admitting: Adolescent Medicine

## 2023-09-22 ENCOUNTER — Ambulatory Visit: Payer: Medicare Other | Admitting: Adolescent Medicine

## 2023-09-22 VITALS — BP 126/81 | HR 76 | Wt 173.9 lb

## 2023-09-22 DIAGNOSIS — E039 Hypothyroidism, unspecified: Secondary | ICD-10-CM | POA: Insufficient documentation

## 2023-09-22 DIAGNOSIS — D804 Selective deficiency of immunoglobulin M [IgM]: Secondary | ICD-10-CM | POA: Insufficient documentation

## 2023-09-22 DIAGNOSIS — R682 Dry mouth, unspecified: Secondary | ICD-10-CM | POA: Insufficient documentation

## 2023-09-22 DIAGNOSIS — M2550 Pain in unspecified joint: Secondary | ICD-10-CM | POA: Insufficient documentation

## 2023-09-22 DIAGNOSIS — D802 Selective deficiency of immunoglobulin A [IgA]: Secondary | ICD-10-CM | POA: Insufficient documentation

## 2023-09-22 DIAGNOSIS — J309 Allergic rhinitis, unspecified: Secondary | ICD-10-CM | POA: Insufficient documentation

## 2023-09-22 DIAGNOSIS — E063 Autoimmune thyroiditis: Secondary | ICD-10-CM | POA: Insufficient documentation

## 2023-09-22 DIAGNOSIS — D806 Antibody deficiency with near-normal immunoglobulins or with hyperimmunoglobulinemia: Secondary | ICD-10-CM | POA: Insufficient documentation

## 2023-09-22 DIAGNOSIS — D803 Selective deficiency of immunoglobulin G [IgG] subclasses: Secondary | ICD-10-CM | POA: Insufficient documentation

## 2023-09-22 DIAGNOSIS — E785 Hyperlipidemia, unspecified: Secondary | ICD-10-CM | POA: Insufficient documentation

## 2023-09-22 DIAGNOSIS — H04123 Dry eye syndrome of bilateral lacrimal glands: Secondary | ICD-10-CM | POA: Insufficient documentation

## 2023-09-22 DIAGNOSIS — Z6829 Body mass index (BMI) 29.0-29.9, adult: Secondary | ICD-10-CM | POA: Insufficient documentation

## 2023-09-22 NOTE — Progress Notes (Signed)
 Date of Evaluation:  September 22, 2023    Referring Physician:  Self, Referred    History of Present Illness:  Patient with sicca symptoms with SSA and SSBA antibodies negative (?seronegative SS), arthralgia, Hashimoto's thyroiditis, IgG subclass deficiency (IgG2 and IgG3), and low IgA  are stable.  9/23  pneumococaal serotype antibody titers are non protective which would suggest that she is losing immunological memory to vaccine, specific antibody deficiency.     Review of Systems:  CNS:  No headache or dizziness.   CVS:  No palpitation or chest pain.  Respiratory:  No cough or shortness of breath.  GI:  Bowel movements are regular  GU:  No frequency or dysuria  Endocrine: normal  Musculoskeletal:  No myalgias or arthralgias.  Peripheral Nerve:  No numbness or tingling.  The remainder of review of system is normal.      Physical Exam:  HEENT:  Throat normal.  Both pupils equal and reacting to light.  Neck:  Supple.  No lymphadenopathy.  Lungs:  Clear.  Heart:  Within normal limits.  Abdomen:  No organomegaly or mass.  No tenderness.  Extremities:  Normal.  Neurological:  Normal  Skin:  No skin rashes.    Assessment and Recommendations:   Patient with sicca symptoms with SSA and SSBA antibodies negative (?seronegative SS), arthralgia, Hashimoto's thyroiditis, IgG subclass deficiency (IgG2 and IgG3), and low IgA and specific antibody deficiency .  She is referred to Dr. Alejandra Hurst. Follow up in 6 months.

## 2024-03-22 ENCOUNTER — Ambulatory Visit
